# Patient Record
Sex: Female | Born: 1995 | Race: White | Hispanic: No | Marital: Single
Health system: Southern US, Community
[De-identification: ages and names within clinical notes are randomized; demographics above are authoritative.]

---
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
## 2013-05-06 ENCOUNTER — Emergency Department: Payer: Self-pay | Admitting: Emergency Medicine

## 2013-05-06 LAB — COMPREHENSIVE METABOLIC PANEL
Albumin: 4.1 g/dL (ref 3.8–5.6)
Alkaline Phosphatase: 58 U/L — ABNORMAL LOW (ref 82–169)
Anion Gap: 7 (ref 7–16)
BUN: 11 mg/dL (ref 7–18)
Bilirubin,Total: 0.3 mg/dL (ref 0.2–1.0)
Calcium, Total: 9.9 mg/dL (ref 9.0–10.7)
Chloride: 105 mmol/L (ref 98–107)
Co2: 26 mmol/L (ref 21–32)
Creatinine: 0.78 mg/dL (ref 0.60–1.30)
EGFR (Non-African Amer.): >60
Glucose: 110 mg/dL — ABNORMAL HIGH (ref 65–99)
Osmolality: 276 (ref 275–301)
Potassium: 4.4 mmol/L (ref 3.5–5.1)
SGOT(AST): 43 U/L — ABNORMAL HIGH (ref 0–26)
Sodium: 138 mmol/L (ref 136–145)

## 2013-05-06 LAB — CBC
HGB: 14 g/dL (ref 12.0–16.0)
MCHC: 35.7 g/dL (ref 32.0–36.0)
Platelet: 325 10*3/uL (ref 150–440)
RBC: 4.15 10*6/uL (ref 3.80–5.20)
RDW: 12.2 % (ref 11.5–14.5)

## 2013-05-06 LAB — URINALYSIS, COMPLETE
Bilirubin,UR: NEGATIVE
Glucose,UR: NEGATIVE mg/dL (ref 0–75)
RBC,UR: 6 /HPF (ref 0–5)
Squamous Epithelial: 6
WBC UR: 20 /HPF (ref 0–5)

## 2013-05-06 LAB — LIPASE, BLOOD: Lipase: 118 U/L (ref 73–393)

## 2013-05-06 LAB — PREGNANCY, URINE: Pregnancy Test, Urine: NEGATIVE m[IU]/mL

## 2014-11-11 NOTE — Consult Note (Signed)
PATIENT NAME:  Shelly Welch, ADCOX MR#:  [id number] DATE OF BIRTH:  05-04-1996  DATE OF CONSULTATION:  10/01/2011  REFERRING PHYSICIAN:  Emergency Room  CONSULTING PHYSICIAN:  Claude Manges, MD  HISTORY OF PRESENT ILLNESS: Ms. Libman was traveling to [place], [place] by airplane today in order to spend the night and go to [employer] freshman orientation tomorrow. When she awoke in [place], [place], she experienced a stomachache (which is not too unusual for her as she has a fairly queasy stomach). She got on the first leg of her travels and felt better by the time she reached [place], [place] and had something to eat in the [place] airport and then got on the second leg of her travels to [employer] and she felt extremely nauseous during that second flight. By the time she reached the [facility] airport, she vomited there. She and her mother state that the flight was quite turbulent and she does have motion sickness when riding in a car. The abdominal pain that she was feeling was in the lower abdomen and that subsided throughout the day such that she no longer is experiencing any abdominal pain. She has not had any fever or chills and has a little bit of chronic constipation. Her last bowel movement was normal and was yesterday. She has not eaten since she has come to the Emergency Room and is hungry.   PAST MEDICAL HISTORY: No medical illnesses.   ALLERGIES: None.   MEDICATIONS: Microgestin 1.5/30 daily.   PAST SURGICAL HISTORY: None.   FAMILY HISTORY: Noncontributory.   SOCIAL HISTORY: The patient is in the Emergency Room with her mother who traveled with her. She has been accepted to [employer] and is currently a senior in college and is interested in sports and event management.   PHYSICAL EXAMINATION:   GENERAL: Pleasant young female in absolutely no distress. Height 5 feet 2 inches, weight 110 pounds, BMI 20.1.   VITAL SIGNS:  Temperature 97.8, pulse 80, respirations 18, blood pressure 151/67, oxygen saturation 98% on room air.   HEENT: Pupils equally round and reactive to light. Extraocular movements intact. Sclerae nonicteric. Oropharynx clear.   NECK: Supple with no thyroid enlargement or lymphadenopathy.   HEART: Regular rate and rhythm with no murmurs or rubs.   LUNGS: Clear to auscultation with normal respiratory effort bilaterally.   ABDOMEN: Completely soft, nontender including to deep palpation in the right lower quadrant and suprapubic area.   EXTREMITIES: No edema with normal capillary refill bilaterally.   NEUROLOGIC: Cranial nerves II through XII, motor and sensation grossly intact.   PSYCHIATRIC: Alert and oriented x4. Appropriate affect and reasonable insight.   LABORATORY, DIAGNOSTIC, AND RADIOLOGICAL DATA: White blood cell count 9.2, hemoglobin 15, hematocrit 43%, platelet count 278,000. Electrolytes all normal. Hepatic profile and lipase normal. Urinalysis is negative. Urine pregnancy test is negative. CT scan report is quite lengthy and complex but is essentially normal. I did not review the CT images myself.    ASSESSMENT: Nausea and vomiting most likely related to motion sickness with possible exacerbation by mild chronic constipation.   PLAN: The patient was advised to eat less cheese and more fruits and vegetables and to proceed with her planned activities at [facility]. No surgical or medical follow-up either here in [place] or when she returns home in [place] is required.   ____________________________ Claude Manges, MD [id number] D: 10/01/2011 21:27:30 ET T: 10/02/2011 09:31:57 ET JOB#: [id number] cc: Claude Manges, MD, <Dictator>  Claude MangesWILLIAM F Moreen Piggott MD ELECTRONICALLY SIGNED 10/09/2011 9:06

## 2015-02-09 IMAGING — CR DG ABDOMEN 3V
1 series · 5 of 5 positions shown · non-contrast
Comparison: none

REASON FOR EXAM: abd pain
COMMENTS:   May transport without cardiac monitor

[Series 1: w chest pa · 0.14mm/px · 5 of 5 slices shown]
[im 1/5]
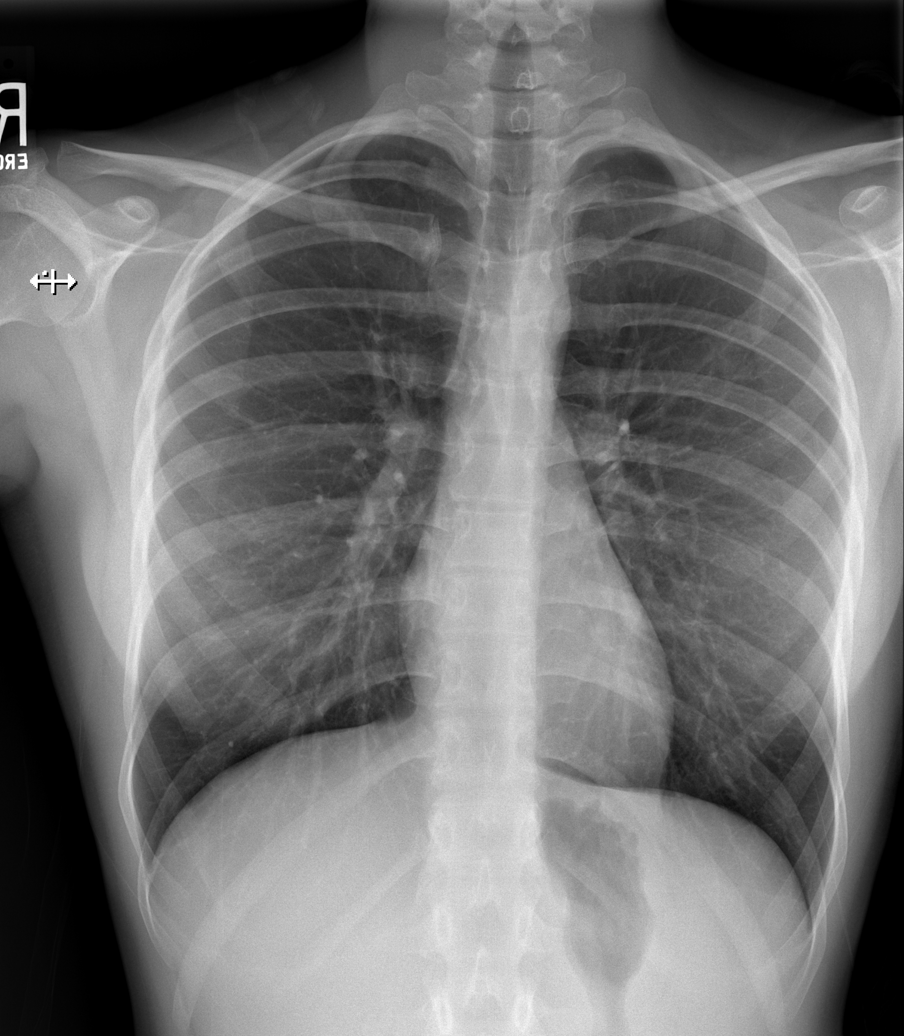
[im 2/5]
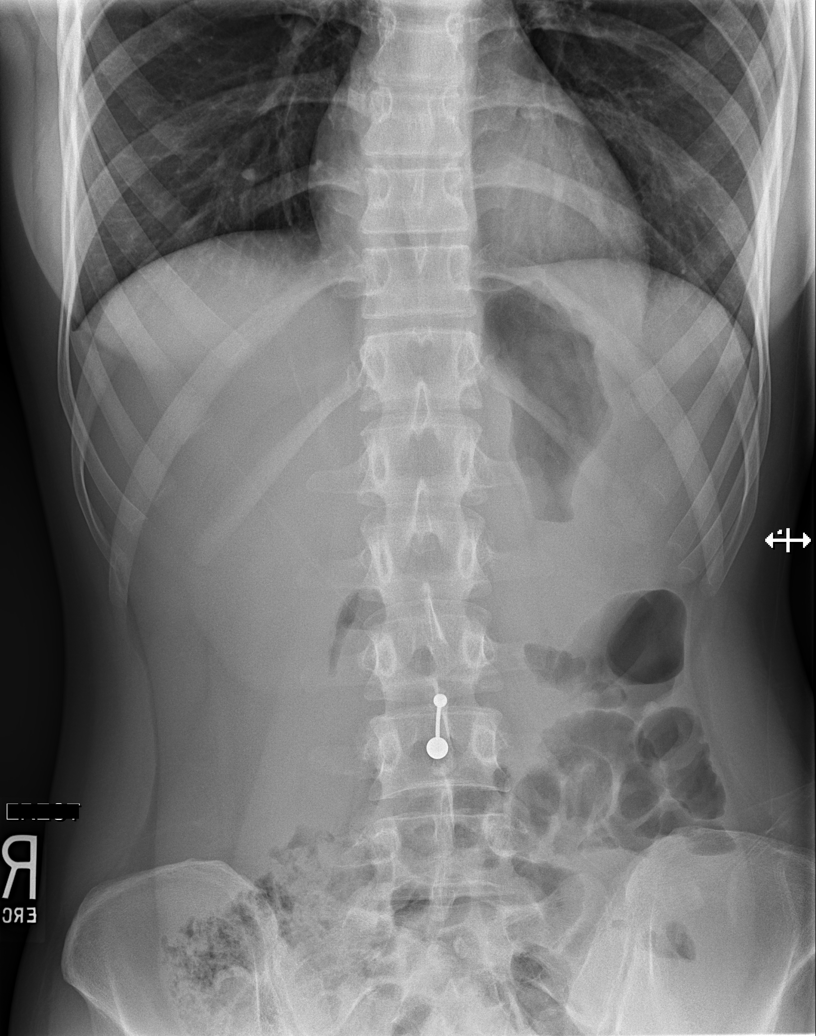
[im 3/5]
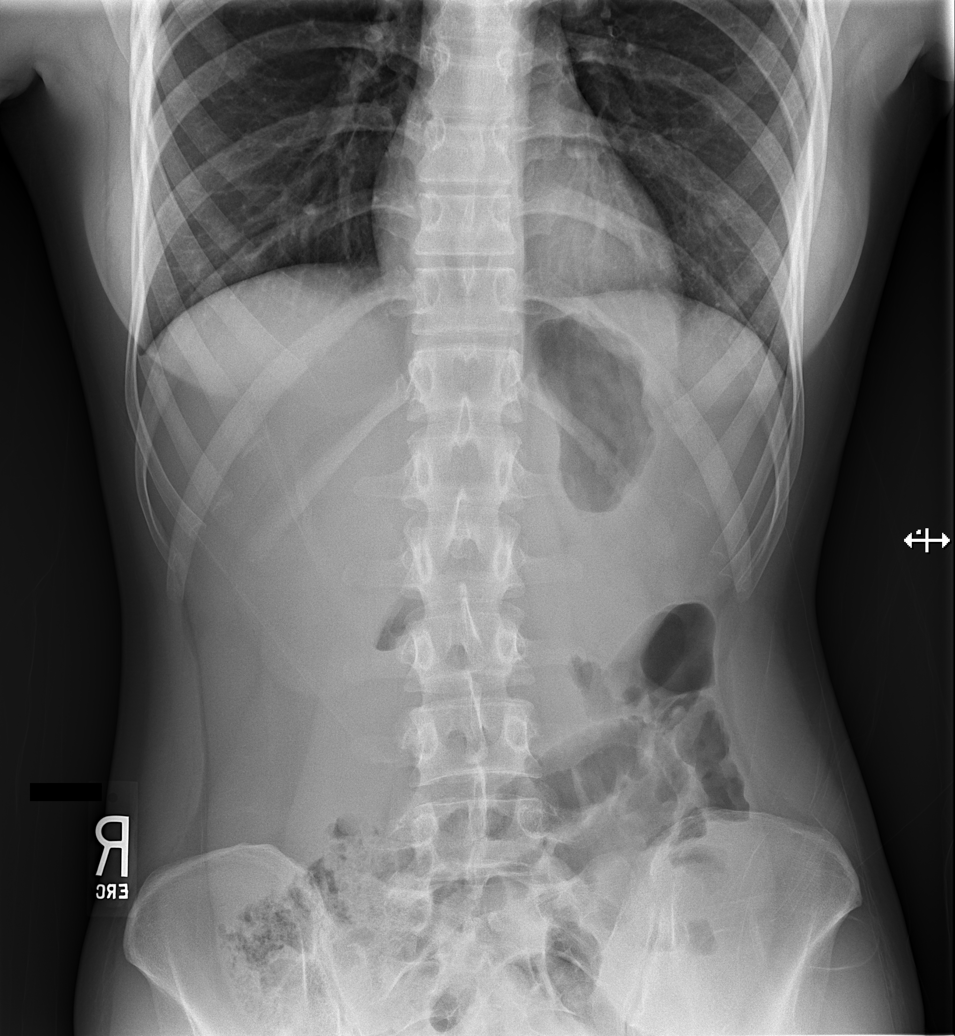
[im 4/5]
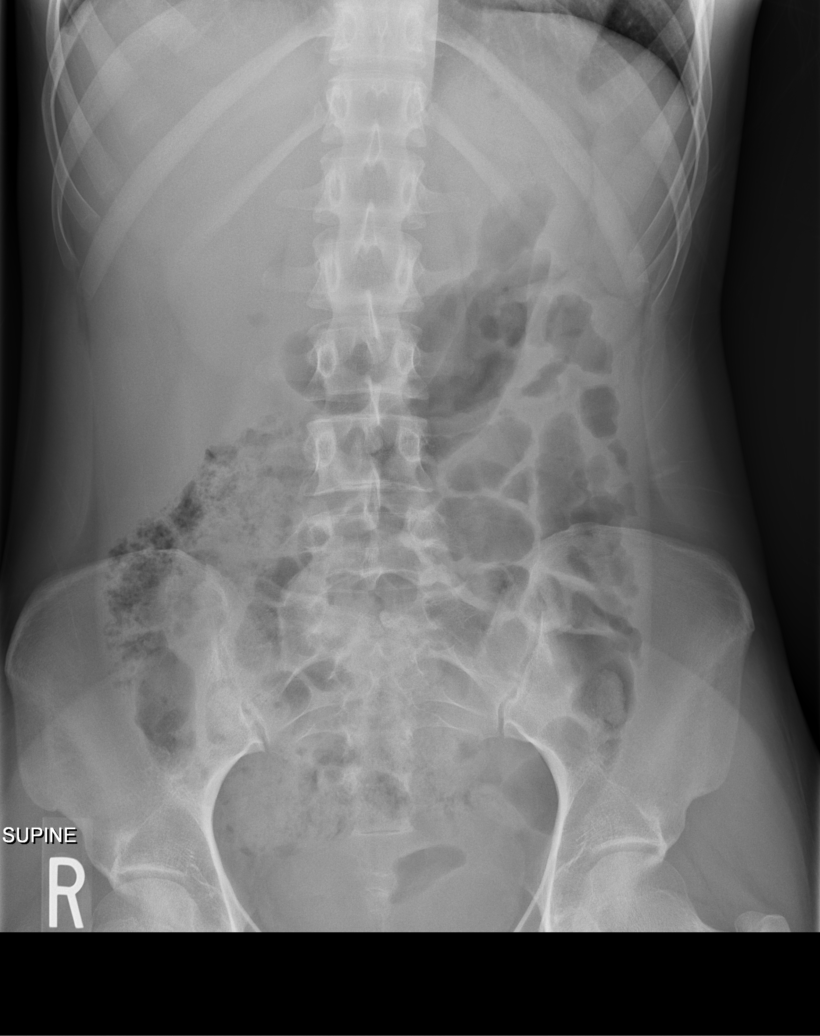
[im 5/5]
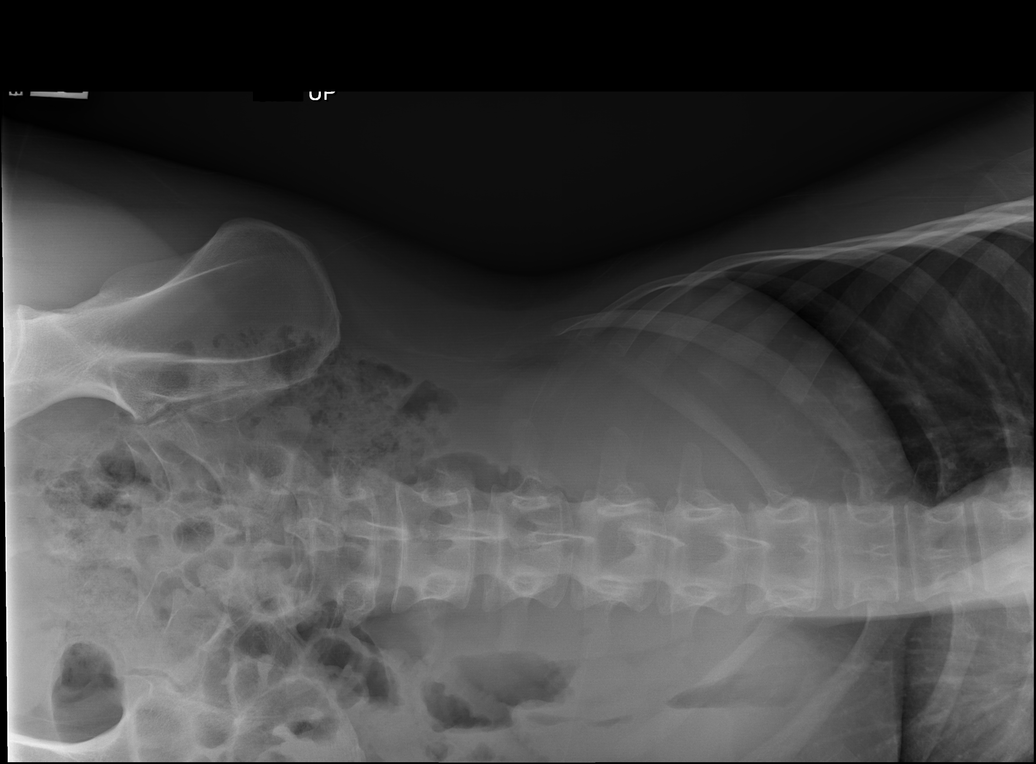

[5 of 5 positions shown; findings below may reference images not displayed]

PROCEDURE:     DXR - DXR ABDOMEN 3-WAY (INCL PA CXR)  - May 06, 2013 [DATE]

RESULT:     There is no previous exam for comparison.

The lungs are clear. The heart and pulmonary vessels are normal. The bony
and mediastinal structures appear within normal limits. The bowel gas
pattern is unremarkable. There is no free air. There is air and fecal
material scattered through the colon to the rectum. There is air in loops of
small bowel without air-fluid levels of any significant prominence. The
stomach contains a moderate amount of air without abnormal distention.
IMPRESSION: 1. No acute cardiopulmonary disease.
2. Nonspecific bowel gas pattern. No definite obstruction or perforation.

[REDACTED]

## 2015-02-09 IMAGING — US ABDOMEN ULTRASOUND
1 series · 13 of 25 positions shown · non-contrast
Comparison: none

REASON FOR EXAM: diffuse abd pain
COMMENTS:   May transport without cardiac monitor

[Series 1: abdomen ultrasound · 0.26mm/px · 13 of 78 slices shown]
[im 1/78]
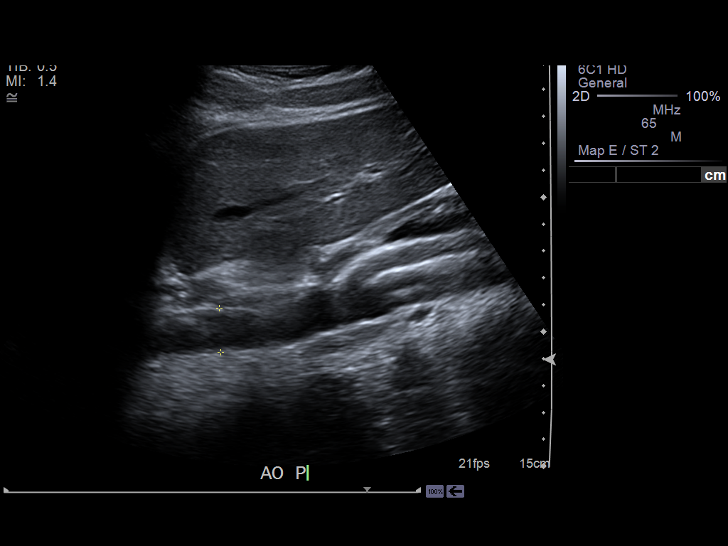
[im 7/78]
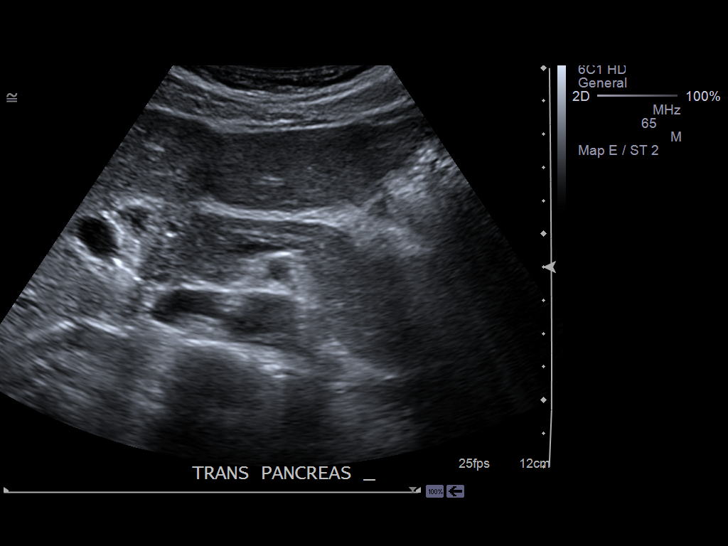
[im 13/78]
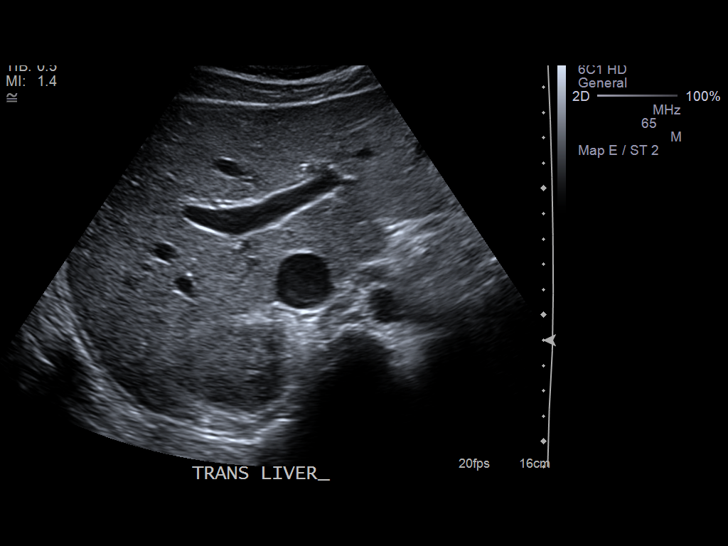
[im 20/78]
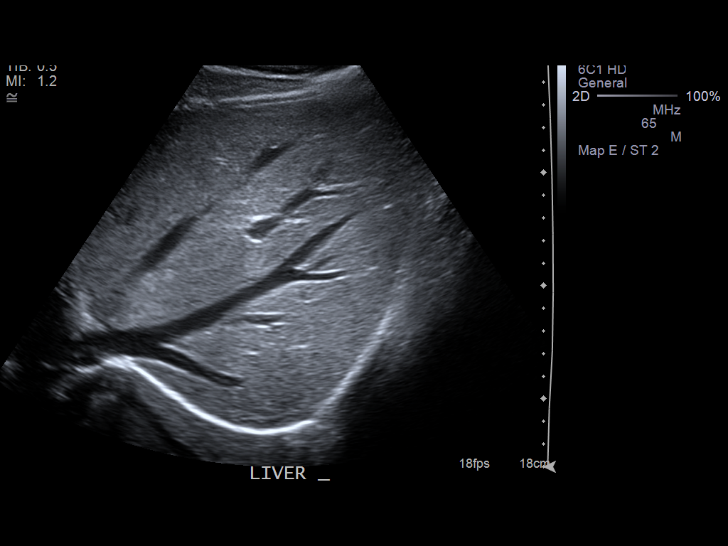
[im 26/78]
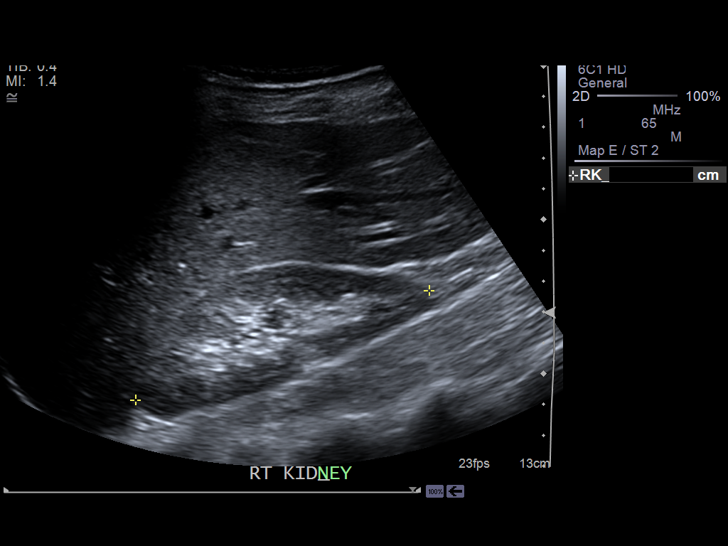
[im 33/78]
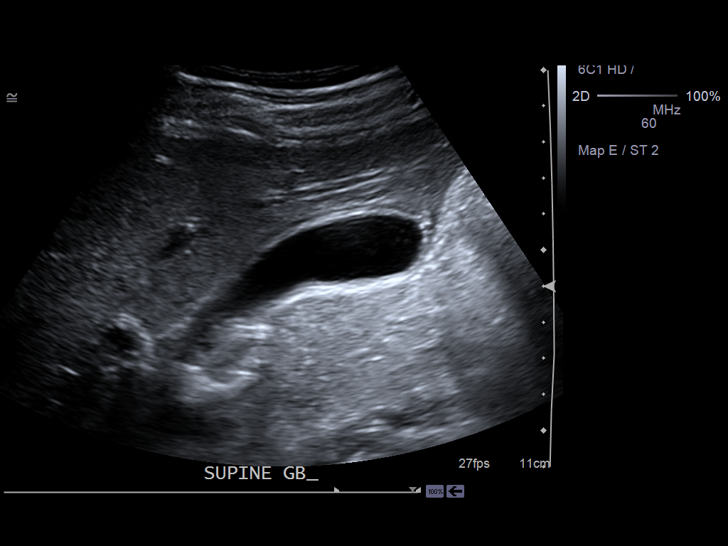
[im 39/78]
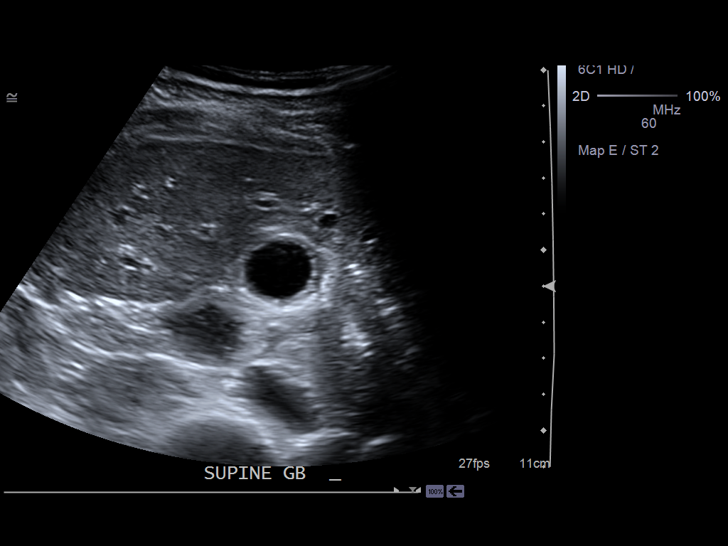
[im 45/78]
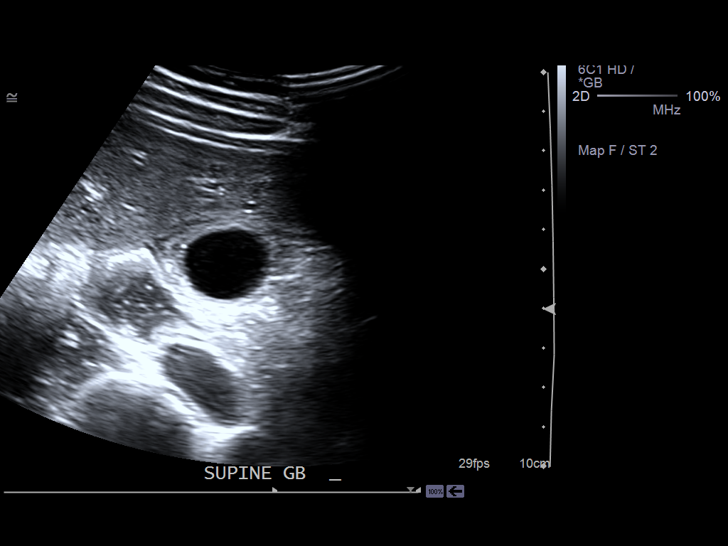
[im 52/78]
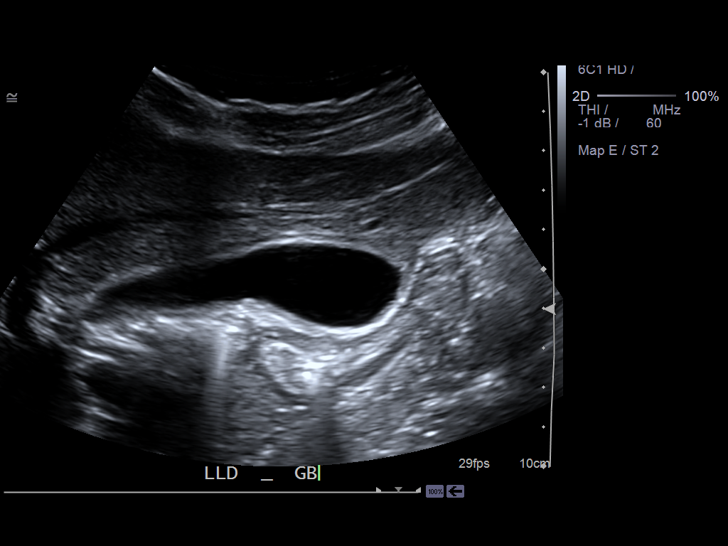
[im 58/78]
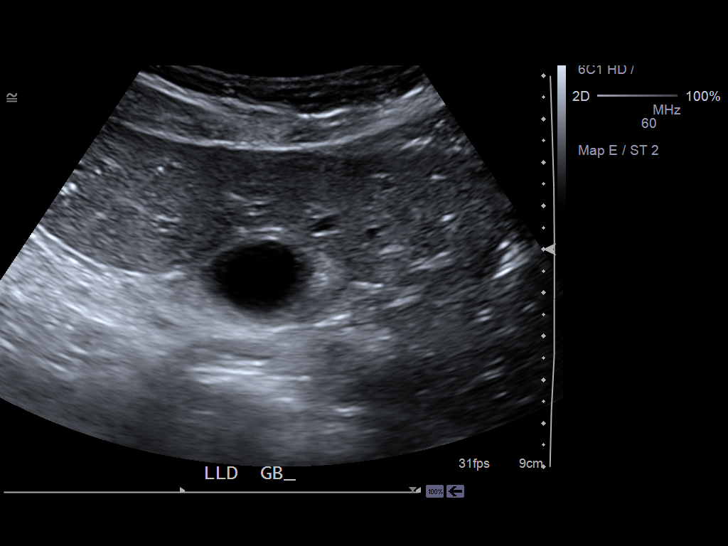
[im 65/78]
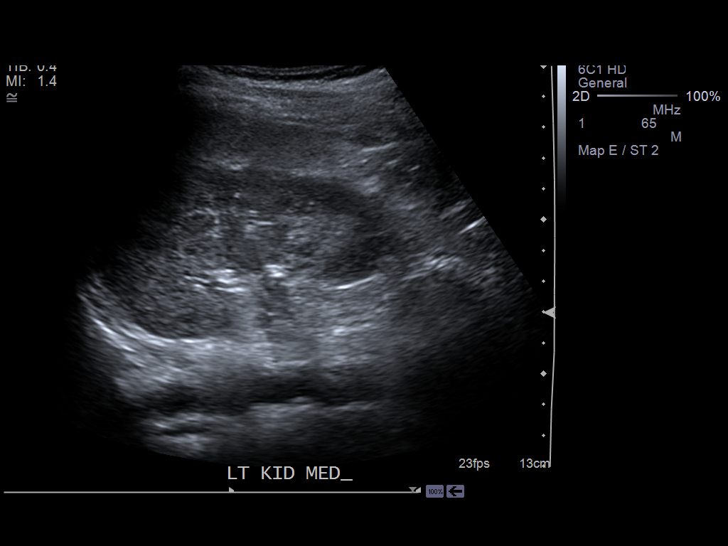
[im 71/78]
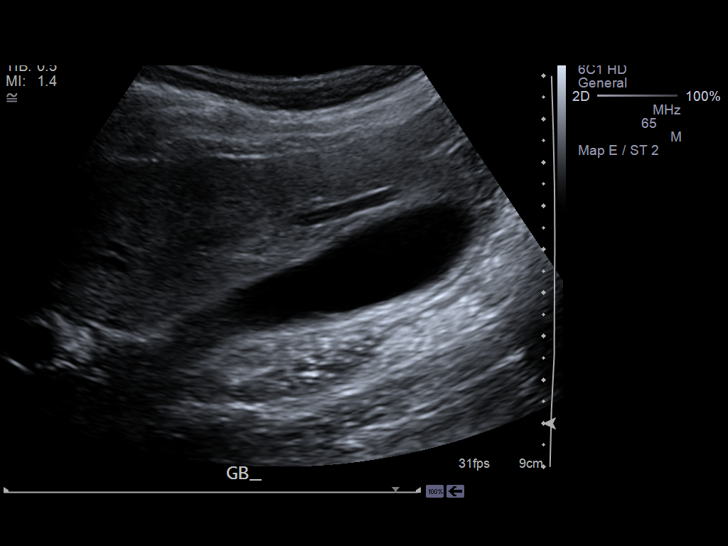
[im 78/78]
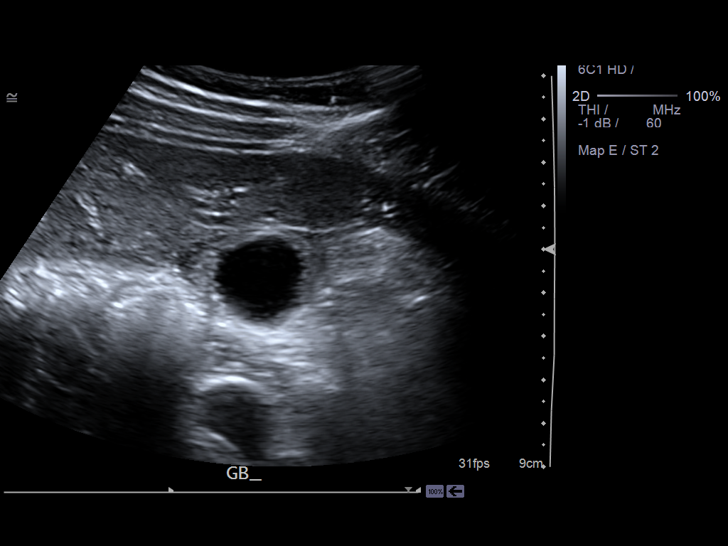

[13 of 25 positions shown; findings below may reference images not displayed]

PROCEDURE:     US  - US ABDOMEN GENERAL SURVEY  - May 07, 2013 [DATE]

RESULT:     Ultrasound of the abdomen and shows normal abdominal aortic
tapering. The proximal inferior vena cava is patent. The visualized pancreas
appears normal. The hepatic echotexture is normal. The portal venous flow is
normal. The liver length is 16.17 cm. The right kidney measures 10.17 x
x 3.99 cm. The gallbladder wall is 2.7 mm in thickness. There is a negative
sonographic Murphy's sign. Some of the images suggest a thin rim of fluid
around the gallbladder. No biliary stones or sludge are seen. The common
bile duct diameter is 4.7 mm. The spleen measures 8.88 x 9.58 x 4.65 cm. The
left kidney measures 10.81 x 5.29 x 4.15 cm. Neither kidney shows stones,
obstructive changes or a solid or cystic mass. The gallbladder does not
appear to be completely distended. On some images the wall appears to be
more prominent than others.
IMPRESSION: 1. No definite evidence of acute cholecystitis or findings of
cholelithiasis. The gallbladder wall is near the upper limits of normal in
thickness. Some images suggest a minimal amount of fluid adjacent to the
gallbladder especially near the fundus.

[REDACTED]
